# Patient Record
Sex: Female | Born: 1990 | Race: Black or African American | Hispanic: No | Marital: Single | Smoking: Former smoker
Health system: Southern US, Community
[De-identification: ages and names within clinical notes are randomized; demographics above are authoritative.]

## PROBLEM LIST (undated history)

## (undated) DIAGNOSIS — N12 Tubulo-interstitial nephritis, not specified as acute or chronic: Secondary | ICD-10-CM

---

## 2013-09-25 ENCOUNTER — Encounter (HOSPITAL_COMMUNITY): Payer: Self-pay | Admitting: Emergency Medicine

## 2013-09-25 ENCOUNTER — Emergency Department (HOSPITAL_COMMUNITY): Payer: Self-pay

## 2013-09-25 ENCOUNTER — Emergency Department (HOSPITAL_COMMUNITY)
Admission: EM | Admit: 2013-09-25 | Discharge: 2013-09-25 | Disposition: A | Discharge status: Home / Self Care | Payer: Self-pay | Attending: Emergency Medicine | Admitting: Emergency Medicine

## 2013-09-25 DIAGNOSIS — N12 Tubulo-interstitial nephritis, not specified as acute or chronic: Secondary | ICD-10-CM | POA: Insufficient documentation

## 2013-09-25 DIAGNOSIS — B9689 Other specified bacterial agents as the cause of diseases classified elsewhere: Secondary | ICD-10-CM | POA: Insufficient documentation

## 2013-09-25 DIAGNOSIS — Z79899 Other long term (current) drug therapy: Secondary | ICD-10-CM | POA: Insufficient documentation

## 2013-09-25 DIAGNOSIS — N760 Acute vaginitis: Secondary | ICD-10-CM | POA: Insufficient documentation

## 2013-09-25 DIAGNOSIS — R112 Nausea with vomiting, unspecified: Secondary | ICD-10-CM | POA: Insufficient documentation

## 2013-09-25 DIAGNOSIS — A499 Bacterial infection, unspecified: Secondary | ICD-10-CM | POA: Insufficient documentation

## 2013-09-25 DIAGNOSIS — K5900 Constipation, unspecified: Secondary | ICD-10-CM | POA: Insufficient documentation

## 2013-09-25 LAB — WET PREP, GENITAL
Trich, Wet Prep: NONE SEEN
WBC, Wet Prep HPF POC: NONE SEEN
Yeast Wet Prep HPF POC: NONE SEEN

## 2013-09-25 LAB — COMPREHENSIVE METABOLIC PANEL
ALT: 65 U/L — ABNORMAL HIGH (ref 0–35)
AST: 164 U/L — ABNORMAL HIGH (ref 0–37)
Albumin: 3.4 g/dL — ABNORMAL LOW (ref 3.5–5.2)
CO2: 23 mEq/L (ref 19–32)
Chloride: 96 mEq/L (ref 96–112)
Creatinine, Ser: 0.88 mg/dL (ref 0.50–1.10)
GFR calc non Af Amer: >90 mL/min (ref >90)
Total Bilirubin: 0.7 mg/dL (ref 0.3–1.2)
Total Protein: 8 g/dL (ref 6.0–8.3)

## 2013-09-25 LAB — CBC
HCT: 37.2 % (ref 36.0–46.0)
MCH: 27.3 pg (ref 26.0–34.0)
MCHC: 34.1 g/dL (ref 30.0–36.0)
MCV: 80 fL (ref 78.0–100.0)
Platelets: 245 10*3/uL (ref 150–400)
RBC: 4.65 MIL/uL (ref 3.87–5.11)
WBC: 20 10*3/uL — ABNORMAL HIGH (ref 4.0–10.5)

## 2013-09-25 LAB — URINALYSIS, ROUTINE W REFLEX MICROSCOPIC
Glucose, UA: NEGATIVE mg/dL
Ketones, ur: >80 mg/dL — AB
Specific Gravity, Urine: 1.02 (ref 1.005–1.030)
Urobilinogen, UA: 1 mg/dL (ref 0.0–1.0)
pH: 6 (ref 5.0–8.0)

## 2013-09-25 LAB — URINE MICROSCOPIC-ADD ON

## 2013-09-25 MED ORDER — ONDANSETRON HCL 4 MG PO TABS: 4.0000 mg | ORAL_TABLET | Freq: Four times a day (QID) | ORAL | Status: DC

## 2013-09-25 MED ORDER — DEXTROSE 5 % IV SOLN
1.0000 g | Freq: Once | INTRAVENOUS | Status: AC
  Administered 2013-09-25: 1 g via INTRAVENOUS
  Filled 2013-09-25: qty 10

## 2013-09-25 MED ORDER — ONDANSETRON HCL 4 MG/2ML IJ SOLN
4.0000 mg | INTRAMUSCULAR | Status: AC
  Administered 2013-09-25: 4 mg via INTRAVENOUS
  Filled 2013-09-25: qty 2

## 2013-09-25 MED ORDER — ACETAMINOPHEN 500 MG PO TABS
1000.0000 mg | ORAL_TABLET | Freq: Once | ORAL | Status: AC
  Administered 2013-09-25: 1000 mg via ORAL
  Filled 2013-09-25: qty 2

## 2013-09-25 MED ORDER — SODIUM CHLORIDE 0.9 % IV SOLN
1000.0000 mL | Freq: Once | INTRAVENOUS | Status: AC
  Administered 2013-09-25: 1000 mL via INTRAVENOUS

## 2013-09-25 MED ORDER — CIPROFLOXACIN HCL 500 MG PO TABS: 500.0000 mg | ORAL_TABLET | Freq: Two times a day (BID) | ORAL | Status: DC

## 2013-09-25 MED ORDER — SODIUM CHLORIDE 0.9 % IV SOLN
1000.0000 mL | INTRAVENOUS | Status: DC
  Administered 2013-09-25: 1000 mL via INTRAVENOUS

## 2013-09-25 MED ORDER — SODIUM CHLORIDE 0.9 % IV BOLUS (SEPSIS)
1000.0000 mL | Freq: Once | INTRAVENOUS | Status: AC
  Administered 2013-09-25: 1000 mL via INTRAVENOUS

## 2013-09-25 MED ORDER — HYDROCODONE-ACETAMINOPHEN 5-325 MG PO TABS: 1.0000 | ORAL_TABLET | ORAL | Status: DC | PRN

## 2013-09-25 MED ORDER — MORPHINE SULFATE 4 MG/ML IJ SOLN
4.0000 mg | Freq: Once | INTRAMUSCULAR | Status: AC
  Administered 2013-09-25: 4 mg via INTRAVENOUS
  Filled 2013-09-25: qty 1

## 2013-09-25 MED ORDER — DEXTROSE 5 % IV SOLN
1.0000 g | Freq: Once | INTRAVENOUS | Status: DC
  Filled 2013-09-25: qty 10

## 2013-09-25 MED ORDER — METRONIDAZOLE 500 MG PO TABS: 500.0000 mg | ORAL_TABLET | Freq: Two times a day (BID) | ORAL | Status: DC

## 2013-09-25 NOTE — ED Provider Notes (Signed)
Medical screening examination/treatment/procedure(s) were performed by non-physician practitioner and as supervising physician I was immediately available for consultation/collaboration.  Shanna Cisco, MD 09/25/13 2231

## 2013-09-25 NOTE — ED Provider Notes (Signed)
Care assumed from [place], [place] at shift change. Patient with L flank pain, elevated LFTs, leukocytosis of 20.0, pyelo. Due to elevated LFTs, abdominal US pending. She is getting IVF and IV abx (rocephin). If no improvement of patient's symptoms, plan to admit pending [place]. 6:31 PM Abdominal ultrasound without any acute findings. On examination, patient is well-appearing and in no apparent distress. She states her pain has subsided. Abdomen soft, nontender, nondistended. She is hemodynamically stable. Do not feel she meets requirements for admission at this time, patient feels comfortable going home. She received 1 g Rocephin IV. Will discharge him with Cipro for pyelo and flagyl for BV. She is eating and drinking well. Stable for discharge. Case discussed with my attending Dr. Micheline Maze who agrees with plan of care.   Trevor Mace, PA-C 09/25/13 1844

## 2013-09-25 NOTE — ED Notes (Signed)
She tells me she has had llq area abd. Pain, plus few episodes (~5) of vomiting since last Sun.  She tells me she had a normal period Sep't. 16, 2014.  She further c/o persistent nausea and nagging generalized h/a.  She is oriented x 4 and in no distress.

## 2013-09-25 NOTE — ED Provider Notes (Signed)
CSN: [id number]     Arrival date & time 09/25/13  1319 History   First MD Initiated Contact with Patient 09/25/13 1342     Chief Complaint  Patient presents with  . Abdominal Pain  . Constipation  . Dysuria   (Consider location/radiation/quality/duration/timing/severity/associated sxs/prior Treatment) HPI Comments: Patient is a 22 year old female with no significant past medical history who presents for persistent abdominal pain times one week. Patient states the pain is waxing and waning in severity and cramping in nature. Pain is present patient's left midabdomen and left lower quadrant. It has been associated with persistent nausea and intermittent nonbloody, nonbilious emesis x4 over the past week. Patient also admits to associated dysuria, subjective fever, and constipation. She denies chest pain, shortness of breath, melena, hematochezia, hematuria, dyspareunia, and numbness/tingling and weakness.  Patient is a 22 y.o. female presenting with abdominal pain, constipation, and dysuria. The history is provided by the patient. No language interpreter was used.  Abdominal Pain Associated symptoms: constipation, dysuria, fever (subjective), nausea and vomiting (NB/NB x 3)   Associated symptoms: no chest pain, no hematuria, no shortness of breath and no vaginal discharge   Constipation Associated symptoms: abdominal pain, dysuria, fever (subjective), nausea and vomiting (NB/NB x 3)   Dysuria Associated symptoms: abdominal pain, fever (subjective), nausea and vomiting (NB/NB x 3)   Associated symptoms: no vaginal discharge     History reviewed. No pertinent past medical history. History reviewed. No pertinent past surgical history. History reviewed. No pertinent family history. History  Substance Use Topics  . Smoking status: Never Smoker   . Smokeless tobacco: Not on file  . Alcohol Use: Yes     Comment: last drink over 2 months ago    OB History   Grav Para Term Preterm Abortions  TAB SAB Ect Mult Living                 Review of Systems  Constitutional: Positive for fever (subjective).  Respiratory: Negative for shortness of breath.   Cardiovascular: Negative for chest pain.  Gastrointestinal: Positive for nausea, vomiting (NB/NB x 3), abdominal pain and constipation. Negative for blood in stool.  Genitourinary: Positive for dysuria. Negative for hematuria and vaginal discharge.  Neurological: Negative for weakness and numbness.  All other systems reviewed and are negative.    Allergies  Review of patient's allergies indicates no known allergies.  Home Medications   Current Outpatient Rx  Name  Route  Sig  Dispense  Refill  . naproxen sodium (ANAPROX) 220 MG tablet   Oral   Take 220 mg by mouth 2 (two) times daily with a meal.          BP 121/79  Pulse 107  Temp(Src) 99.8 F (37.7 C) (Oral)  Resp 16  SpO2 100%  LMP 08/30/2013  Physical Exam  Nursing note and vitals reviewed. Constitutional: She is oriented to person, place, and time. She appears well-developed and well-nourished. No distress.  HENT:  Head: Normocephalic and atraumatic.  Eyes: Conjunctivae and EOM are normal. No scleral icterus.  Neck: Normal range of motion.  Cardiovascular: Regular rhythm, normal heart sounds and intact distal pulses.   Tachy to 102  Pulmonary/Chest: Effort normal. No respiratory distress. She has no wheezes. She has no rales.  Abdominal: Soft. She exhibits no distension and no mass. There is tenderness (LLQ and L CVA tenderness). There is no rebound and no guarding.  No peritoneal signs or guarding  Genitourinary: Uterus normal. There is no rash, tenderness, lesion  or injury on the right labia. There is no rash, tenderness, lesion or injury on the left labia. Cervix exhibits discharge (bleeding). Cervix exhibits no motion tenderness and no friability. Right adnexum displays no mass, no tenderness and no fullness. Left adnexum displays no mass, no tenderness  and no fullness. No erythema, tenderness or bleeding around the vagina. No signs of injury around the vagina. Vaginal discharge (bleeding) found.  Musculoskeletal: Normal range of motion.  Neurological: She is alert and oriented to person, place, and time.  Skin: Skin is warm and dry. No rash noted. She is not diaphoretic. No erythema. No pallor.  Psychiatric: She has a normal mood and affect. Her behavior is normal.    ED Course  Procedures (including critical care time) Labs Review Labs Reviewed  WET PREP, GENITAL - Abnormal; Notable for the following:    Clue Cells Wet Prep HPF POC MODERATE (*)    All other components within normal limits  URINALYSIS, ROUTINE W REFLEX MICROSCOPIC - Abnormal; Notable for the following:    Color, Urine AMBER (*)    APPearance TURBID (*)    Hgb urine dipstick LARGE (*)    Bilirubin Urine SMALL (*)    Ketones, ur >80 (*)    Protein, ur 100 (*)    Leukocytes, UA LARGE (*)    All other components within normal limits  CBC - Abnormal; Notable for the following:    WBC 20.0 (*)    All other components within normal limits  COMPREHENSIVE METABOLIC PANEL - Abnormal; Notable for the following:    Sodium 132 (*)    Potassium 3.4 (*)    Glucose, Bld 104 (*)    Albumin 3.4 (*)    AST 164 (*)    ALT 65 (*)    Alkaline Phosphatase 136 (*)    All other components within normal limits  LIPASE, BLOOD - Abnormal; Notable for the following:    Lipase 143 (*)    All other components within normal limits  URINE MICROSCOPIC-ADD ON - Abnormal; Notable for the following:    Bacteria, UA MANY (*)    All other components within normal limits  GC/CHLAMYDIA PROBE AMP  URINE CULTURE  CULTURE, BLOOD (ROUTINE X 2)  CULTURE, BLOOD (ROUTINE X 2)   Imaging Review No results found.  EKG Interpretation   None       MDM   1. Sepsis     Patient is a 22 year old female who presents for persistent abdominal pain with dysuria, persistent nausea and intermittent  emesis. Patient denies any emesis today. Patient is an otherwise healthy female. She is febrile today to 101.37F orally and tachycardic to 107 beats per minute with a white count of 20,000. Lab significant also for increased LFTs, increased alkaline phosphatase, and elevated lipase. Urinalysis suggestive of infection with too numerous to count white blood cells and many bacteria in the presence of few skin cells. Patient meets sepsis criteria. Lactate, blood cultures, and CXR ordered. Have also ordered abdominal U/S for further evaluation of symptoms. 1g IV Rocephin and fluid maintenance ordered.  Patient signed out to Johnnette Gourd, PA-C at shift change with imaging pending. Anticipate admission of patient once imaging resulted.     Antony Madura, PA-C 09/25/13 1552

## 2013-09-25 NOTE — ED Notes (Signed)
As I write this, she is in final stages of abd. U/s.  She is awake, alert and in no distress.

## 2013-09-25 NOTE — ED Notes (Signed)
Pt reports c/o of abdominal pain and vomiting x1 week. Reports vomited last on Friday, abdominal pain 7/10 at present. Last bowel movement over 2 weeks ago. reprots dysuria.

## 2013-09-26 LAB — GC/CHLAMYDIA PROBE AMP: CT Probe RNA: POSITIVE — AB

## 2013-09-27 LAB — URINE CULTURE

## 2013-09-27 NOTE — ED Provider Notes (Signed)
Medical screening examination/treatment/procedure(s) were performed by non-physician practitioner and as supervising physician I was immediately available for consultation/collaboration.   Bassheva Flury, MD 09/27/13 2303 

## 2013-09-28 NOTE — ED Notes (Signed)
Chart sent to EDP office for review.+ chlamydia 

## 2013-10-01 LAB — CULTURE, BLOOD (ROUTINE X 2)
Culture: NO GROWTH
Culture: NO GROWTH

## 2013-10-03 NOTE — ED Notes (Signed)
Chart returned from EDP office with orders written by Smith Mince for Azithromycin 1 gram po x 1

## 2013-10-05 ENCOUNTER — Telehealth (HOSPITAL_COMMUNITY): Payer: Self-pay | Admitting: *Deleted

## 2013-10-08 ENCOUNTER — Telehealth (HOSPITAL_COMMUNITY): Payer: Self-pay | Admitting: Emergency Medicine

## 2013-10-08 NOTE — ED Notes (Signed)
Unable to contact patient via phone. Sent letter. °

## 2013-10-28 NOTE — ED Notes (Signed)
No response to letter sent after 30 days. Chart sent to Medical Records. °

## 2015-01-15 ENCOUNTER — Other Ambulatory Visit (HOSPITAL_COMMUNITY): Payer: Self-pay | Admitting: Nurse Practitioner

## 2015-01-15 DIAGNOSIS — Z3689 Encounter for other specified antenatal screening: Secondary | ICD-10-CM

## 2015-01-19 ENCOUNTER — Ambulatory Visit (HOSPITAL_COMMUNITY): Payer: Self-pay

## 2015-12-16 NOTE — L&D Delivery Note (Signed)
25 y.o. G3P0020 at 6553w0d delivered via vacuum-assisted vaginal delivery a viable female infant in cephalic, ROA position. Vacuum applied in front of posterior fontanelle, and with appropriate suction during a contraction and assistance with good maternal effort in pushing, gentle traction was applied to deliver head to +3 station. After four pushes with no pop-offs, fetal head was delivered to perineum without complication, suction was released. No nuchal cord. Left anterior shoulder delivered with ease. 60 sec delayed cord clamping. Cord clamped x2 and cut. Placenta delivered spontaneously intact, with 3VC. Fundus firm on exam with massage and pitocin. Good hemostasis noted.  Laceration: Second degree laceration Suture: 3-0 Vicryl Good hemostasis noted.  Mom and baby recovering in LDR.    Apgars: 7/8 Weight: pending   Jen MowElizabeth Mumaw, DO [facility] Fellow [facility] 09/03/2016, 9:44 AM

## 2016-03-24 LAB — OB RESULTS CONSOLE RPR: RPR: NONREACTIVE

## 2016-03-24 LAB — OB RESULTS CONSOLE HEPATITIS B SURFACE ANTIGEN: Hepatitis B Surface Ag: NEGATIVE

## 2016-03-24 LAB — OB RESULTS CONSOLE GC/CHLAMYDIA
CHLAMYDIA, DNA PROBE: NEGATIVE
GC PROBE AMP, GENITAL: NEGATIVE

## 2016-03-24 LAB — OB RESULTS CONSOLE HIV ANTIBODY (ROUTINE TESTING): HIV: NONREACTIVE

## 2016-03-24 LAB — OB RESULTS CONSOLE RUBELLA ANTIBODY, IGM: RUBELLA: IMMUNE

## 2016-05-01 ENCOUNTER — Encounter (HOSPITAL_COMMUNITY): Payer: Self-pay | Admitting: Physician Assistant

## 2016-05-01 ENCOUNTER — Other Ambulatory Visit (HOSPITAL_COMMUNITY): Payer: Self-pay | Admitting: Physician Assistant

## 2016-05-01 DIAGNOSIS — Z3A20 20 weeks gestation of pregnancy: Secondary | ICD-10-CM

## 2016-05-01 DIAGNOSIS — Z141 Cystic fibrosis carrier: Secondary | ICD-10-CM

## 2016-05-01 DIAGNOSIS — Z3689 Encounter for other specified antenatal screening: Secondary | ICD-10-CM

## 2016-05-15 ENCOUNTER — Ambulatory Visit (HOSPITAL_COMMUNITY)
Admission: RE | Admit: 2016-05-15 | Discharge: 2016-05-15 | Disposition: A | Discharge status: Home / Self Care | Payer: Medicaid Other | Source: Ambulatory Visit | Attending: Physician Assistant | Admitting: Physician Assistant

## 2016-05-15 ENCOUNTER — Encounter (HOSPITAL_COMMUNITY): Payer: Self-pay

## 2016-05-15 DIAGNOSIS — Z3689 Encounter for other specified antenatal screening: Secondary | ICD-10-CM

## 2016-05-15 DIAGNOSIS — O09892 Supervision of other high risk pregnancies, second trimester: Secondary | ICD-10-CM | POA: Insufficient documentation

## 2016-05-15 DIAGNOSIS — Z141 Cystic fibrosis carrier: Secondary | ICD-10-CM | POA: Insufficient documentation

## 2016-05-15 DIAGNOSIS — Z3A20 20 weeks gestation of pregnancy: Secondary | ICD-10-CM | POA: Insufficient documentation

## 2016-05-15 DIAGNOSIS — O280 Abnormal hematological finding on antenatal screening of mother: Secondary | ICD-10-CM | POA: Insufficient documentation

## 2016-05-15 DIAGNOSIS — Z3A21 21 weeks gestation of pregnancy: Secondary | ICD-10-CM | POA: Insufficient documentation

## 2016-05-15 DIAGNOSIS — Z36 Encounter for antenatal screening of mother: Secondary | ICD-10-CM | POA: Insufficient documentation

## 2016-05-15 NOTE — Progress Notes (Signed)
Genetic Counseling  High-Risk Gestation Note  Appointment Date:  05/15/2016 Referred By: Randolm Idol, PA-C Date of Birth:  1991/03/29 Partner:  Allie Dimmer   Pregnancy History: [id number] Estimated Date of Delivery: 09/24/16 Estimated Gestational Age: 10w1dAttending: MRenella Cunas MD   I met with 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    In summary:   Discussed cystic fibrosis and autosomal recessive inheritance  Discussed availability of carrier screening for the father of the baby  Given no known family history and his reported ethnicity, background carrier frequency for him is approximately 1 in 610 Reviewed limitations of screening  Reviewed risks to the fetus prior to carrier screening for the father of the baby  Risk for cystic fibrosis in current pregnancy is approximately 1 in 236(0.4%), prior to carrier screening   Patient reported father of the pregnancy is not likely interested in pursuing CF carrier screening  Discussed option of prenatal diagnosis only when mutations have been identified in both parents  Declined amniocentesis   Understand limitation of ultrasound in diagnosis of CF  Patient is comfortable with newborn screening for CF and understands the limitations of NBS for CF  Reviewed borderline MSAFP (2.11 MoM), screen negative for ONTDs  Detailed ultrasound performed today  Reviewed additional family history concern of sister to patient with cleft lip and palate   We reviewed the results of Ms. BStanford BreedCF carrier screening.  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 CF carrier screening has not yet been performed for the father of the pregnancy.  Ms. CScalisireported no additional relatives known to be CF carriers and no known relatives with cystic fibrosis.  The  patient reported no known individuals with cystic fibrosis in the family history for the father of the pregnancy, and consanguinity for the couple was denied. He reportedly has African American ancestry.   We spent time discussing cystic fibrosis. Classic features of CF include thickened secretions in the lungs, digestive and reproductive systems. This life-limiting condition is characterized by chronic respiratory infections requiring daily chest therapies and pancreatic dysfunction disrupting the body's ability to break down food and extract nutrients as it should, which may restrict growth. Infertility commonly occurs in males. With therapies, such as daily respiratory therapies and medications to aid digestion, the median lifespan for people with CF is now mid-30's. Treatment may involve lung transplantation in some cases. There can be significant variability in the severity of symptoms and expression of the disease. Expression of the disease depends upon the specific mutations present in an individual with CF.   We spent time reviewing the autosomal recessive inheritance of CF. CF is a common genetic condition in the Caucasian population occurring in approximately 1 in 336,300Caucasian births. This means approximately 1 in 29 Caucasians is a CF carrier. We discussed that the carrier frequency in the African American population is approximately 1 in 686  We discussed that individuals who are carriers have one copy of the CFTR gene with a disease causing mutation, and their other CFTR gene copy functions correctly. Thus, carriers typically do not have associated medical symptoms. We discussed that when both parents are carriers for CF, each pregnancy has an independent chance for one of the following outcomes: a 25% chance to inherit both mutations and thus have CF; a 50% chance to inherit one gene  mutation and be a carrier similar to parents; and a 25% chance to be neither a carrier nor have CF. When one  parent is a CF carrier but the other is not, then each pregnancy has a 1 in 2 chance to be a CF carrier but would not be expected to be at increased risk to inherit CF.   There are known to be thousands of mutations which can cause the CFTR gene to not function properly. Carrier screening is available to assess for the most common disease causing mutations. However, carrier screening does not identify all CF carriers. Thus, a negative CF carrier screen would reduce, but not eliminate, the chance to be a CF carrier and thus the chance for CF in a pregnancy.  Detection rates vary with ethnicity and with the number of mutations screened by the particular lab performing the test.   We reviewed that when both parents are identified to be CF carriers, prenatal diagnosis via amniocentesis would be available, if desired. The risks, benefits, and limitations of amniocentesis were reviewed. A fetus with cystic fibrosis typically appears normal on targeted ultrasound, although rarely echogenic bowel is visualized. However, the presence of echogenic bowel on targeted ultrasound is not diagnostic for CF in a pregnancy, nor does the absence of echogenic bowel on ultrasound rule out CF in the pregnancy. We discussed that postnatal testing for CF can also be performed for babies identified to be at risk to inherit CF. She understands that in [place], the newborn screening test will detect CF, but that carriers may come back as false positives.  Given that the father of the pregnancy reports no family history of CF, he would have the general population chance to be a carrier, or approximately 1 in 41, prior to carrier screening. Thus, prior to carrier screening for him, the chance for an affected pregnancy with cystic fibrosis is approximately 1 in 260 (0.4%).  We discussed that CF carrier screening for the father of the pregnancy would further refine the risk for CF in the current pregnancy.  The patient reported  that she previously discussed this with the father of the pregnancy and that he is not likely interested in CF carrier screening. She stated that she would not be interested in amniocentesis in pregnancy for cystic fibrosis and is comfortable with newborn screening for CF.   Both family histories were reviewed and were otherwise contributory for cleft lip and palate for the patient's twin sister (reportedly fraternal twins). Her sister is otherwise healthy. We discussed that cleft lip +/- cleft palate can be syndromic or isolated.  If the patient's relative has a syndromic form of clefting, the chance of having an affected child depends on the inheritance pattern of that condition.  If the patient's relative has an isolated form of clefting, we discussed the probable multifactorial inheritance and explained that genetic testing for isolated cleft lip +/- cleft palate is not currently available.  Based on the family history, the patient's chance to have a baby with an isolated cleft lip +/- cleft palate is ~ 0.6%. We reviewed the benefits and limitations of detailed ultrasound to assess for orofacial clefting in pregnancy. Detailed ultrasound was performed today. Complete ultrasound results reported separately. Without further information regarding the provided family history, an accurate genetic risk cannot be calculated. Further genetic counseling is warranted if more information is obtained.  Additionally, we reviewed the results of Quad screen performed through [facility] [address]. MSAFP was reported to  be borderline elevated (2.11 MoM). We reviewed that this is technically considered a screen negative result for open neural tube defects. We reviewed the benefits and limitations of Quad screening. Detailed ultrasound was performed today.   Mrs. Zyion Leidner denied exposure to environmental toxins or chemical agents. She denied the use of alcohol, tobacco or street drugs. She  denied significant viral illnesses during the course of her pregnancy. Her medical and surgical histories were noncontributory.   I counseled Ms. Stanford Walton regarding the above risks and available options.  The approximate face-to-face time with the genetic counselor was 30 minutes.  Chipper Oman, MS Certified Genetic Counselor 05/15/2016

## 2016-05-28 ENCOUNTER — Other Ambulatory Visit (HOSPITAL_COMMUNITY): Payer: Self-pay

## 2016-09-02 ENCOUNTER — Inpatient Hospital Stay (HOSPITAL_COMMUNITY)
Admission: AD | Admit: 2016-09-02 | Discharge: 2016-09-04 | DRG: 775 | Disposition: A | Discharge status: Home / Self Care | Payer: Self-pay | Source: Ambulatory Visit | Attending: Obstetrics and Gynecology | Admitting: Obstetrics and Gynecology

## 2016-09-02 ENCOUNTER — Inpatient Hospital Stay (HOSPITAL_COMMUNITY): Payer: Self-pay | Admitting: Anesthesiology

## 2016-09-02 ENCOUNTER — Encounter (HOSPITAL_COMMUNITY): Payer: Self-pay | Admitting: *Deleted

## 2016-09-02 DIAGNOSIS — Z3A36 36 weeks gestation of pregnancy: Secondary | ICD-10-CM

## 2016-09-02 DIAGNOSIS — O42019 Preterm premature rupture of membranes, onset of labor within 24 hours of rupture, unspecified trimester: Secondary | ICD-10-CM | POA: Insufficient documentation

## 2016-09-02 DIAGNOSIS — O26893 Other specified pregnancy related conditions, third trimester: Secondary | ICD-10-CM | POA: Diagnosis present

## 2016-09-02 DIAGNOSIS — Z87891 Personal history of nicotine dependence: Secondary | ICD-10-CM

## 2016-09-02 DIAGNOSIS — Z141 Cystic fibrosis carrier: Secondary | ICD-10-CM

## 2016-09-02 DIAGNOSIS — Z6791 Unspecified blood type, Rh negative: Secondary | ICD-10-CM

## 2016-09-02 DIAGNOSIS — O42013 Preterm premature rupture of membranes, onset of labor within 24 hours of rupture, third trimester: Principal | ICD-10-CM | POA: Diagnosis present

## 2016-09-02 HISTORY — DX: Tubulo-interstitial nephritis, not specified as acute or chronic: N12

## 2016-09-02 LAB — CBC
HCT: 37.4 % (ref 36.0–46.0)
Hemoglobin: 12.8 g/dL (ref 12.0–15.0)
MCH: 28.6 pg (ref 26.0–34.0)
MCHC: 34.2 g/dL (ref 30.0–36.0)
MCV: 83.5 fL (ref 78.0–100.0)
PLATELETS: 164 10*3/uL (ref 150–400)
RBC: 4.48 MIL/uL (ref 3.87–5.11)
RDW: 13.6 % (ref 11.5–15.5)
WBC: 9.2 10*3/uL (ref 4.0–10.5)

## 2016-09-02 LAB — POCT FERN TEST: POCT Fern Test: POSITIVE

## 2016-09-02 MED ORDER — LIDOCAINE HCL (PF) 1 % IJ SOLN
30.0000 mL | INTRAMUSCULAR | Status: DC | PRN
  Administered 2016-09-03: 30 mL via SUBCUTANEOUS
  Filled 2016-09-02: qty 30

## 2016-09-02 MED ORDER — FENTANYL 2.5 MCG/ML BUPIVACAINE 1/10 % EPIDURAL INFUSION (WH - ANES)
14.0000 mL/h | INTRAMUSCULAR | Status: DC | PRN
  Administered 2016-09-02 – 2016-09-03 (×2): 14 mL/h via EPIDURAL
  Filled 2016-09-02 (×2): qty 125

## 2016-09-02 MED ORDER — EPHEDRINE 5 MG/ML INJ
10.0000 mg | INTRAVENOUS | Status: AC | PRN
  Administered 2016-09-03 (×2): 10 mg via INTRAVENOUS

## 2016-09-02 MED ORDER — OXYCODONE-ACETAMINOPHEN 5-325 MG PO TABS
2.0000 | ORAL_TABLET | ORAL | Status: DC | PRN
Start: 2016-09-02 — End: 2016-09-03

## 2016-09-02 MED ORDER — OXYTOCIN 40 UNITS IN LACTATED RINGERS INFUSION - SIMPLE MED: 2.5000 [IU]/h | INTRAVENOUS | Status: DC

## 2016-09-02 MED ORDER — PENICILLIN G POTASSIUM 5000000 UNITS IJ SOLR
5.0000 10*6.[IU] | Freq: Once | INTRAMUSCULAR | Status: AC
  Administered 2016-09-02: 5 10*6.[IU] via INTRAVENOUS
  Filled 2016-09-02: qty 5

## 2016-09-02 MED ORDER — SOD CITRATE-CITRIC ACID 500-334 MG/5ML PO SOLN: 30.0000 mL | ORAL | Status: DC | PRN

## 2016-09-02 MED ORDER — TERBUTALINE SULFATE 1 MG/ML IJ SOLN: 0.2500 mg | Freq: Once | INTRAMUSCULAR | Status: DC | PRN

## 2016-09-02 MED ORDER — BETAMETHASONE SOD PHOS & ACET 6 (3-3) MG/ML IJ SUSP
12.0000 mg | Freq: Two times a day (BID) | INTRAMUSCULAR | Status: DC
  Administered 2016-09-02: 12 mg via INTRAMUSCULAR
  Filled 2016-09-02 (×2): qty 2

## 2016-09-02 MED ORDER — OXYTOCIN BOLUS FROM INFUSION
500.0000 mL | Freq: Once | INTRAVENOUS | Status: AC
  Administered 2016-09-03: 500 mL via INTRAVENOUS

## 2016-09-02 MED ORDER — PENICILLIN G POTASSIUM 5000000 UNITS IJ SOLR
2.5000 10*6.[IU] | INTRAMUSCULAR | Status: DC
  Administered 2016-09-02 – 2016-09-03 (×3): 2.5 10*6.[IU] via INTRAVENOUS
  Filled 2016-09-02 (×5): qty 2.5

## 2016-09-02 MED ORDER — PHENYLEPHRINE 40 MCG/ML (10ML) SYRINGE FOR IV PUSH (FOR BLOOD PRESSURE SUPPORT)
80.0000 ug | PREFILLED_SYRINGE | INTRAVENOUS | Status: DC | PRN
  Administered 2016-09-03: 02:00:00 via INTRAVENOUS
  Filled 2016-09-02 (×2): qty 10

## 2016-09-02 MED ORDER — ONDANSETRON HCL 4 MG/2ML IJ SOLN: 4.0000 mg | Freq: Four times a day (QID) | INTRAMUSCULAR | Status: DC | PRN

## 2016-09-02 MED ORDER — BETAMETHASONE SOD PHOS & ACET 6 (3-3) MG/ML IJ SUSP: 12.0000 mg | Freq: Once | INTRAMUSCULAR | Status: DC

## 2016-09-02 MED ORDER — OXYTOCIN 40 UNITS IN LACTATED RINGERS INFUSION - SIMPLE MED
1.0000 m[IU]/min | INTRAVENOUS | Status: DC
  Administered 2016-09-02: 2 m[IU]/min via INTRAVENOUS
  Filled 2016-09-02: qty 1000

## 2016-09-02 MED ORDER — PHENYLEPHRINE 40 MCG/ML (10ML) SYRINGE FOR IV PUSH (FOR BLOOD PRESSURE SUPPORT)
80.0000 ug | PREFILLED_SYRINGE | INTRAVENOUS | Status: DC | PRN
  Administered 2016-09-02 – 2016-09-03 (×2): 80 ug via INTRAVENOUS

## 2016-09-02 MED ORDER — LACTATED RINGERS IV SOLN
INTRAVENOUS | Status: DC
  Administered 2016-09-02 – 2016-09-03 (×4): via INTRAVENOUS

## 2016-09-02 MED ORDER — EPHEDRINE 5 MG/ML INJ
10.0000 mg | INTRAVENOUS | Status: DC | PRN
  Filled 2016-09-02: qty 4

## 2016-09-02 MED ORDER — LACTATED RINGERS IV SOLN
500.0000 mL | Freq: Once | INTRAVENOUS | Status: AC
  Administered 2016-09-02: 500 mL via INTRAVENOUS

## 2016-09-02 MED ORDER — ACETAMINOPHEN 325 MG PO TABS: 650.0000 mg | ORAL_TABLET | ORAL | Status: DC | PRN

## 2016-09-02 MED ORDER — LACTATED RINGERS IV SOLN
500.0000 mL | INTRAVENOUS | Status: DC | PRN
  Administered 2016-09-02 – 2016-09-03 (×2): 500 mL via INTRAVENOUS

## 2016-09-02 MED ORDER — DIPHENHYDRAMINE HCL 50 MG/ML IJ SOLN: 12.5000 mg | INTRAMUSCULAR | Status: DC | PRN

## 2016-09-02 MED ORDER — FLEET ENEMA 7-19 GM/118ML RE ENEM: 1.0000 | ENEMA | RECTAL | Status: DC | PRN

## 2016-09-02 MED ORDER — OXYCODONE-ACETAMINOPHEN 5-325 MG PO TABS: 1.0000 | ORAL_TABLET | ORAL | Status: DC | PRN

## 2016-09-02 NOTE — MAU Note (Signed)
States she felt a gush of clear fluid around 1425 and has continued. Denies pain or contractions.

## 2016-09-02 NOTE — Anesthesia Pain Management Evaluation Note (Signed)
  CRNA Pain Management Visit Note  Patient: Alyssa Walton, 25 y.o., female  "Hello [facility] am [facility]. We have an anesthesia team available at all times to provide care throughout the hospital, including epidural management and anesthesia for C-section. I don't know your plan for the delivery whether it a natural birth, water birth, IV sedation, nitrous supplementation, doula or epidural, but we want to meet your pain goals."   1.Was your pain managed to your expectations on prior hospitalizations?    2.What is your expectation for pain management during this hospitalization?      3.How can we help you reach that goal?   Record the patient's initial score and the patient's pain goal.   Pain: 6  Pain Goal: 8 [facility] wants you to be able to say your pain was always managed very well.  Alyssa Walton,Alyssa Walton 09/02/2016

## 2016-09-02 NOTE — Anesthesia Preprocedure Evaluation (Signed)
Anesthesia Evaluation  Patient identified by MRN, date of birth, ID band Patient awake    Reviewed: Allergy & Precautions, NPO status , Patient's Chart, lab work & pertinent test results  Airway Mallampati: II  TM Distance: >3 FB Neck ROM: Full    Dental no notable dental hx.    Pulmonary former smoker,    Pulmonary exam normal breath sounds clear to auscultation       Cardiovascular negative cardio ROS Normal cardiovascular exam Rhythm:Regular Rate:Normal     Neuro/Psych negative neurological ROS  negative psych ROS   GI/Hepatic negative GI ROS, Neg liver ROS,   Endo/Other  negative endocrine ROS  Renal/GU negative Renal ROS  negative genitourinary   Musculoskeletal negative musculoskeletal ROS (+)   Abdominal   Peds negative pediatric ROS (+)  Hematology negative hematology ROS (+)   Anesthesia Other Findings   Reproductive/Obstetrics negative OB ROS                            Anesthesia Physical Anesthesia Plan  ASA: II  Anesthesia Plan: Epidural   Post-op Pain Management:    Induction: Intravenous  Airway Management Planned: Natural Airway  Additional Equipment:   Intra-op Plan:   Post-operative Plan:   Informed Consent: I have reviewed the patients History and Physical, chart, labs and discussed the procedure including the risks, benefits and alternatives for the proposed anesthesia with the patient or authorized representative who has indicated his/her understanding and acceptance.   Dental advisory given  Plan Discussed with: CRNA  Anesthesia Plan Comments: (Informed consent obtained prior to proceeding including risk of failure, 1% risk of PDPH, risk of minor discomfort and bruising.  Discussed rare but serious complications including epidural abscess, permanent nerve injury, epidural hematoma.  Discussed alternatives to epidural analgesia and patient desires to  proceed.  Timeout performed pre-procedure verifying patient name, procedure, and platelet count.  Patient tolerated procedure well. )      Anesthesia Quick Evaluation  

## 2016-09-02 NOTE — H&P (Signed)
LABOR AND DELIVERY ADMISSION HISTORY AND PHYSICAL NOTE  Alyssa Walton is a 25 y.o. female G3P0020 with IUP at 6921w6d by LMP and 20.4 wk U/S presenting for SOL.  States felt her water break around 2pm today and then started having contractions upon arrival to MAU approximately 2 hours later. She reports positive fetal movement. She denies vaginal bleeding.  Prenatal History/Complications: none  Past Medical History: Past Medical History:  Diagnosis Date  . Pyelonephritis     Past Surgical History: History reviewed. No pertinent surgical history.  Obstetrical History: OB History    Gravida Para Term Preterm AB Living   3       2     SAB TAB Ectopic Multiple Live Births     2            Social History: Social History   Social History  . Marital status: Single    Spouse name: N/A  . Number of children: N/A  . Years of education: N/A   Social History Main Topics  . Smoking status: Former Games developermoker  . Smokeless tobacco: Never Used  . Alcohol use Yes     Comment: last drink over 2 months ago   . Drug use: No  . Sexual activity: Not Asked   Other Topics Concern  . None   Social History Narrative  . None    Family History: Family History  Problem Relation Age of Onset  . Cleft lip Sister   . Cleft palate Sister     Allergies: No Known Allergies  Prescriptions Prior to Admission  Medication Sig Dispense Refill Last Dose  . ciprofloxacin (CIPRO) 500 MG tablet Take 1 tablet (500 mg total) by mouth 2 (two) times daily. (Patient not taking: Reported on 05/15/2016) 20 tablet 0 Not Taking  . HYDROcodone-acetaminophen (NORCO/VICODIN) 5-325 MG per tablet Take 1-2 tablets by mouth every 4 (four) hours as needed for pain. (Patient not taking: Reported on 05/15/2016) 10 tablet 0 Not Taking  . metroNIDAZOLE (FLAGYL) 500 MG tablet Take 1 tablet (500 mg total) by mouth 2 (two) times daily. One po bid x 7 days (Patient not taking: Reported on 05/15/2016) 14 tablet 0 Not Taking  .  naproxen sodium (ANAPROX) 220 MG tablet Take 220 mg by mouth 2 (two) times daily with a meal. Reported on 05/15/2016   Not Taking  . ondansetron (ZOFRAN) 4 MG tablet Take 1 tablet (4 mg total) by mouth every 6 (six) hours. (Patient not taking: Reported on 05/15/2016) 12 tablet 0 Not Taking  . Prenatal Vit w/Fe-Methylfol-FA (PNV PO) Take by mouth.   Taking     Review of Systems   All systems reviewed and negative except as stated in HPI  Blood pressure 111/74, pulse 106, temperature 99.3 F (37.4 C), temperature source Oral, resp. rate 16, height 5' 6.5" (1.689 m), weight 64.9 kg (143 lb), last menstrual period 12/19/2015. General appearance: alert, cooperative and no distress Lungs: no respiratory distress Heart: regular rate Abdomen: soft, non-tender Extremities: No calf swelling or tenderness Presentation: cephalic by cervical exam in MAU by RN Fetal monitoring: 140 baseline, moderate variability, +accelerations, no decelerations Uterine activity: q2-75min Dilation: 1.5 Effacement (%): 70 Station: -3 Exam by:: Sarajane MarekS. Carrera, RNC   Prenatal labs: ABO, Rh:  B neg Antibody:  neg Rubella: immune RPR:   neg HBsAg:   neg HIV:   nonreactive GBS:   unknown  Prenatal Transfer Tool  Maternal Diabetes: No Genetic Screening: Normal Maternal Ultrasounds/Referrals: Normal Fetal Ultrasounds or  other Referrals:  None Maternal Substance Abuse:  No Significant Maternal Medications:  None Significant Maternal Lab Results: Lab values include: Other:  GBS unknown  Results for orders placed or performed during the hospital encounter of 09/02/16 (from the past 24 hour(s))  [facility] Time: 09/02/16  4:18 PM  Result Value Ref Range   POCT Fern Test Positive = ruptured amniotic membanes     Patient Active Problem List   Diagnosis Date Noted  . 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 Abnormal maternal serum screening test 05/15/2016  . [redacted] weeks  gestation of pregnancy     Assessment: Alyssa Walton is a 25 y.o. G3P0020 at [redacted]w[redacted]d here for SOL  #Labor:expectant management #Pain: Planning on epidural #FWB: Category I #ID:  GBS unknown #MOF: breast and bottle #MOC: undecided #Circ:  Girl, n/a  Leland Her, DO PGY-1 9/19/20174:39 PM   OB FELLOW HISTORY AND PHYSICAL ATTESTATION  I have seen and examined this patient; I agree with above documentation in the resident's note.    Jen Mow, DO OB Fellow 09/02/2016, 11:37 PM

## 2016-09-03 ENCOUNTER — Encounter (HOSPITAL_COMMUNITY): Payer: Self-pay | Admitting: *Deleted

## 2016-09-03 DIAGNOSIS — O42013 Preterm premature rupture of membranes, onset of labor within 24 hours of rupture, third trimester: Secondary | ICD-10-CM

## 2016-09-03 DIAGNOSIS — Z141 Cystic fibrosis carrier: Secondary | ICD-10-CM

## 2016-09-03 DIAGNOSIS — O26893 Other specified pregnancy related conditions, third trimester: Secondary | ICD-10-CM

## 2016-09-03 DIAGNOSIS — Z87891 Personal history of nicotine dependence: Secondary | ICD-10-CM

## 2016-09-03 DIAGNOSIS — Z3A36 36 weeks gestation of pregnancy: Secondary | ICD-10-CM

## 2016-09-03 DIAGNOSIS — Z6791 Unspecified blood type, Rh negative: Secondary | ICD-10-CM

## 2016-09-03 LAB — RPR: RPR Ser Ql: NONREACTIVE

## 2016-09-03 MED ORDER — ONDANSETRON HCL 4 MG/2ML IJ SOLN: 4.0000 mg | INTRAMUSCULAR | Status: DC | PRN

## 2016-09-03 MED ORDER — DIBUCAINE 1 % RE OINT: 1.0000 "application " | TOPICAL_OINTMENT | RECTAL | Status: DC | PRN

## 2016-09-03 MED ORDER — LIDOCAINE HCL (PF) 1 % IJ SOLN
INTRAMUSCULAR | Status: DC | PRN
  Administered 2016-09-02 (×2): 6 mL

## 2016-09-03 MED ORDER — SODIUM CHLORIDE 0.9 % IV SOLN: 250.0000 mL | INTRAVENOUS | Status: DC | PRN

## 2016-09-03 MED ORDER — COCONUT OIL OIL: 1.0000 "application " | TOPICAL_OIL | Status: DC | PRN

## 2016-09-03 MED ORDER — WITCH HAZEL-GLYCERIN EX PADS: 1.0000 "application " | MEDICATED_PAD | CUTANEOUS | Status: DC | PRN

## 2016-09-03 MED ORDER — LACTATED RINGERS IV SOLN
500.0000 mL | Freq: Once | INTRAVENOUS | Status: AC
  Administered 2016-09-03: 500 mL via INTRAVENOUS

## 2016-09-03 MED ORDER — OXYTOCIN 40 UNITS IN LACTATED RINGERS INFUSION - SIMPLE MED: 2.5000 [IU]/h | INTRAVENOUS | Status: DC | PRN

## 2016-09-03 MED ORDER — ACETAMINOPHEN 325 MG PO TABS
650.0000 mg | ORAL_TABLET | ORAL | Status: DC | PRN
  Administered 2016-09-03: 650 mg via ORAL
  Filled 2016-09-03: qty 2

## 2016-09-03 MED ORDER — ZOLPIDEM TARTRATE 5 MG PO TABS: 5.0000 mg | ORAL_TABLET | Freq: Every evening | ORAL | Status: DC | PRN

## 2016-09-03 MED ORDER — SIMETHICONE 80 MG PO CHEW: 80.0000 mg | CHEWABLE_TABLET | ORAL | Status: DC | PRN

## 2016-09-03 MED ORDER — PRENATAL MULTIVITAMIN CH
1.0000 | ORAL_TABLET | Freq: Every day | ORAL | Status: DC
  Administered 2016-09-04: 1 via ORAL
  Filled 2016-09-03: qty 1

## 2016-09-03 MED ORDER — TETANUS-DIPHTH-ACELL PERTUSSIS 5-2.5-18.5 LF-MCG/0.5 IM SUSP: 0.5000 mL | Freq: Once | INTRAMUSCULAR | Status: DC

## 2016-09-03 MED ORDER — SODIUM CHLORIDE 0.9% FLUSH: 3.0000 mL | INTRAVENOUS | Status: DC | PRN

## 2016-09-03 MED ORDER — BENZOCAINE-MENTHOL 20-0.5 % EX AERO
1.0000 "application " | INHALATION_SPRAY | CUTANEOUS | Status: DC | PRN
  Administered 2016-09-03: 1 via TOPICAL
  Filled 2016-09-03: qty 56

## 2016-09-03 MED ORDER — ONDANSETRON HCL 4 MG PO TABS: 4.0000 mg | ORAL_TABLET | ORAL | Status: DC | PRN

## 2016-09-03 MED ORDER — SODIUM CHLORIDE 0.9% FLUSH: 3.0000 mL | Freq: Two times a day (BID) | INTRAVENOUS | Status: DC

## 2016-09-03 MED ORDER — DIPHENHYDRAMINE HCL 25 MG PO CAPS: 25.0000 mg | ORAL_CAPSULE | Freq: Four times a day (QID) | ORAL | Status: DC | PRN

## 2016-09-03 MED ORDER — IBUPROFEN 600 MG PO TABS
600.0000 mg | ORAL_TABLET | Freq: Four times a day (QID) | ORAL | Status: DC
  Administered 2016-09-03 – 2016-09-04 (×4): 600 mg via ORAL
  Filled 2016-09-03 (×4): qty 1

## 2016-09-03 MED ORDER — SENNOSIDES-DOCUSATE SODIUM 8.6-50 MG PO TABS
2.0000 | ORAL_TABLET | ORAL | Status: DC
  Administered 2016-09-04: 2 via ORAL
  Filled 2016-09-03: qty 2

## 2016-09-03 NOTE — Lactation Note (Signed)
This note was copied from a baby's chart. Lactation Consultation Note  Patient Name: Alyssa Walton RUEAV'WToday's Date: 09/03/2016 Reason for consult: Initial assessment;Infant < 6lbs;Late preterm infant   Initial consult with first time mom of 1 hour old infant. Infant born at 37w GAweighing 5 lb 4.7 oz. Infant was being held STS with mom and was quietly alert.   Assisted mom in latching infant to right breast in cross cradle hold. Infant latched easily with flanged lips. She needed chin readjustment due to clicking and narrow gape. Mom with compressible breasts and short shaft everted nipples. Nipple to right breast small than on left. Colostrum was easily expressible from both breasts. Enc mom to hand express prior to latch and to compress/masage breast with feedng. Infant fed for 15 minutes and then was burped and latched to left breast where she fed for another 15 minutes. She was noted to have intermittent swallows. Infant stopped feeding at about 30 minutes and was taken to warmer for NB assessment. Infant blood glucose was drawn at the end of the feeding.   Bf basics reviewed with mom. [employer] and [employer] hold reviewed. Reviewed LPT infant policy with parents and discussed importance of feeding infant at least every 2-3 hours at first feeding cues, awakening at 3 hours as needed to get infant to feed. Mom voiced understanding. Discussed with mom that spoon feeding will be taught and encouraged for infant. Feeding log given and parents advised to keep feeding log.   BF Resources Handout and LC Brochure given, mom informed of LC phone #, IP/OP Services, and BF Support Groups. [facility] and was informed to call WIC post d/c for appt. Mom does not have a pump at home. Enc mom to call out to desk for feeding assistance as needed.    Maternal Data Formula Feeding for Exclusion: No Has patient been taught Hand Expression?: Yes Does the patient have breastfeeding experience prior to  this delivery?: No  Feeding Feeding Type: Breast Fed Length of feed: 30 min  LATCH Score/Interventions Latch: Grasps breast easily, tongue down, lips flanged, rhythmical sucking.  Audible Swallowing: Spontaneous and intermittent  Type of Nipple: Everted at rest and after stimulation  Comfort (Breast/Nipple): Soft / non-tender     Hold (Positioning): Assistance needed to correctly position infant at breast and maintain latch. Intervention(s): Breastfeeding basics reviewed;Support Pillows;Position options;Skin to skin  LATCH Score: 9  Lactation Tools Discussed/Used WIC Program: Yes   Consult Status Consult Status: Follow-up Date: 09/03/16 Follow-up type: In-patient    Silas FloodSharon S Ronisha Herringshaw 09/03/2016, 10:59 AM

## 2016-09-03 NOTE — Anesthesia Procedure Notes (Signed)
Epidural Patient location during procedure: OB Start time: 09/02/2016 9:55 PM  Staffing Anesthesiologist: Sherrian DiversENENNY, Elon Lomeli  Preanesthetic Checklist Completed: patient identified, site marked, surgical consent, pre-op evaluation, timeout performed, IV checked, risks and benefits discussed and monitors and equipment checked  Epidural Patient position: sitting Prep: DuraPrep Patient monitoring: blood pressure and heart rate Approach: midline Location: L4-L5 Injection technique: LOR saline  Needle:  Needle type: Tuohy  Needle gauge: 17 G Needle length: 9 cm Needle insertion depth: 5 cm Catheter at skin depth: 13 cm Test dose: negative and Other  Assessment Events: blood not aspirated, injection not painful, no injection resistance, negative IV test and no paresthesia  Additional Notes Reason for block:procedure for pain

## 2016-09-03 NOTE — Anesthesia Postprocedure Evaluation (Signed)
Anesthesia Post Note  Patient: Alyssa SneddonBenita Walton  Procedure(s) Performed: * No procedures listed *  Patient location during evaluation: Mother Baby Anesthesia Type: Epidural Level of consciousness: awake and alert Pain management: pain level controlled Vital Signs Assessment: post-procedure vital signs reviewed and stable Respiratory status: spontaneous breathing, nonlabored ventilation and respiratory function stable Cardiovascular status: stable Postop Assessment: no headache, no backache and epidural receding Anesthetic complications: no     Last Vitals:  Vitals:   09/03/16 1115 09/03/16 1220  BP: (!) 93/58 94/66  Pulse: (!) 59 (!) 59  Resp: 16 16  Temp: 36.3 C 36.4 C    Last Pain:  Vitals:   09/03/16 1220  TempSrc: Oral  PainSc:    Pain Goal:                 Junious SilkGILBERT,Jeana Kersting

## 2016-09-03 NOTE — Lactation Note (Signed)
This note was copied from a baby's chart. Lactation Consultation Note  Patient Name: Alyssa Marjory SneddonBenita Schorsch JXBJY'NToday's Date: 09/03/2016 Reason for consult: Follow-up assessment  F/u visit for early-term infant. Parents were very involved with their phones when I was trying to speak with them. When I asked how infant had bottle-fed w/the last feeding, they could not give me a quantity. Mom finally showed me with her finger about how much EBM had been in the bottle. It appears that the infant only took 2mL.  Mom no longer wants to breastfeed. Formula provided (regular Similac). I reminded them to feed q3h & explained that if infant has such poor intake w/a bottle at the next feeding, they need to let their RN know. Denyce Robert. Scott, RN made aware of the above.  Lurline HareRichey, Ronne Savoia [facility] 09/03/2016, 7:47 PM

## 2016-09-03 NOTE — Progress Notes (Signed)
Patient ID: Alyssa SneddonBenita Piggott, female   DOB: 1991-05-25, 25 y.o.   MRN: [id number]  S: Received call from RN due to FWB. Patient is comfortable with epidural. RN has done position changes.  O: Vitals:   09/02/16 2330 09/02/16 2348 09/02/16 2350 09/02/16 2357  BP: (!) 97/49  (!) 98/55 108/68  Pulse: 75  75 76  Resp: 16  16 16   Temp:  97.7 F (36.5 C)    TempSrc:  Oral    SpO2:    97%  Weight:      Height:        Dilation: 4 Effacement (%): 80 Cervical Position: Posterior Station: -2 Presentation: Vertex Exam by:: Len ChildsK. Lashley RNC  FSE placed Pitocin was at 4, has been shut off IVF bolus Phenylephrine has been given previously (baseline BPs have been 90s/50s prior to epidural), most recent BP 108/68  A/P: Pitocin break for now Await improvement in FWB Continue phenylephrine for BPs <90/50

## 2016-09-03 NOTE — Anesthesia Rounding Note (Signed)
  CRNA Epidural Rounding Note  Patient: Marjory SneddonBenita Quizon, 25 y.o., female  Patient's current pain level: 'good' per patient  Agreed upon pain management level: 6  Epidural intervention: No   Comments: Patient complete and beginning to push  [facility], IndianapolisEIGHT,Arijana Narayan 09/03/2016

## 2016-09-03 NOTE — Progress Notes (Addendum)
Patient ID: Marjory SneddonBenita Bensinger, female   DOB: [birth year], 25 y.o.   MRN: [id number]  S: Patient seen and examined for progress of labor/pushing. Patient has been pushing for 2 hours. Patient comfortable under epidural anesthesia, not feeling contractions or pressure.  O:  Vitals:   09/03/16 0715 09/03/16 0720  BP: 98/60   Pulse: 99   Resp:  16  Temp:     Dilation: 10 Dilation Complete Date: 09/03/16 Dilation Complete Time: 0244 Effacement (%): 100 Cervical Position: Posterior Station: 0, +1 Presentation: Vertex Exam by:: AThersa Salt. Schwarz RN  Minimal caput noted OA position with suture lines  Patient pushing with decent maternal effort. Contractions are occurring about every 4-6 min. Pitocin at 7.5.   FHT: 115, min to mod variability, no accels, no decels. TOCO: q4-6 min.  A/P: Expectant management for anticipated SVD. Let patient labor down, increase pitocin in meantime. EFW: 6lbs by leopold's Will reassess in a couple hours.

## 2016-09-04 LAB — CBC
HEMATOCRIT: 32.2 % — AB (ref 36.0–46.0)
HEMOGLOBIN: 11.4 g/dL — AB (ref 12.0–15.0)
MCH: 29.4 pg (ref 26.0–34.0)
MCHC: 35.4 g/dL (ref 30.0–36.0)
MCV: 83 fL (ref 78.0–100.0)
Platelets: 219 10*3/uL (ref 150–400)
RBC: 3.88 MIL/uL (ref 3.87–5.11)
RDW: 13.8 % (ref 11.5–15.5)
WBC: 11.8 10*3/uL — AB (ref 4.0–10.5)

## 2016-09-04 LAB — CULTURE, BETA STREP (GROUP B ONLY)

## 2016-09-04 MED ORDER — IBUPROFEN 600 MG PO TABS: 600.0000 mg | ORAL_TABLET | Freq: Four times a day (QID) | ORAL | 0 refills | Status: AC | PRN

## 2016-09-04 NOTE — Discharge Instructions (Signed)

## 2016-09-04 NOTE — Discharge Summary (Signed)
OB Discharge Summary     Patient Name: Alyssa Walton DOB: September 13, 1991 MRN: [id number]  Date of admission: 09/02/2016 Delivering MD: Michaele OfferMUMAW, ELIZABETH WOODLAND   Date of discharge: 09/04/2016  Admitting diagnosis: 36w water broke Intrauterine pregnancy: Delivered @ 37.0wks Secondary diagnosis:  Active Problems:   Preterm PROM with onset of labor within 24 hours of rupture  Additional problems: Rh neg     Discharge diagnosis: Term Pregnancy Delivered                                                                                                Post partum procedures:potential Rhogam- still awaiting baby's Rh status  Augmentation: Pitocin  Complications: None  Hospital course:  Onset of Labor With Vaginal Delivery     25 y.o. yo [id number] at 6448w0d was admitted in Latent Labor on 09/02/2016. Patient had an uncomplicated labor course as follows:  Membrane Rupture Time/Date: 2:45 PM ,09/02/2016   Intrapartum Procedures: Episiotomy: None [1]                                         Lacerations:  2nd degree [3]  After pushing >2hrs pt was offered a VAVD- see Delivery Note for details. Patient had a delivery of a Viable infant. 09/03/2016  Information for the patient's newborn:  Alyssa Walton, Alyssa Walton [[id number]][[id number]]  Delivery Method: Vag-Vacuum    Pateint had an uncomplicated postpartum course.  She is ambulating, tolerating a regular diet, passing flatus, and urinating well. Patient is discharged home in stable condition on 09/04/16. Pt may need Rhogam prior to d/c- still awaiting baby's Rh status.    Physical exam Vitals:   09/03/16 1115 09/03/16 1220 09/03/16 1555 09/04/16 0531  BP: (!) 93/58 94/66 (!) 94/48 (!) 96/52  Pulse: (!) 59 (!) 59 67 (!) 59  Resp: 16 16 16 18   Temp: 97.4 F (36.3 C) 97.6 F (36.4 C) 98.4 F (36.9 C) 98.1 F (36.7 C)  TempSrc: Oral Oral Oral   SpO2:      Weight:      Height:       General: alert and cooperative Lochia: appropriate Uterine Fundus:  firm Incision: N/A DVT Evaluation: No evidence of DVT seen on physical exam. Labs: Lab Results  Component Value Date   WBC 11.8 (H) 09/04/2016   HGB 11.4 (L) 09/04/2016   HCT 32.2 (L) 09/04/2016   MCV 83.0 09/04/2016   PLT 219 09/04/2016   CMP Latest Ref Rng & Units 09/25/2013  Glucose 70 - 99 mg/dL 130(Q104(H)  BUN 6 - 23 mg/dL 6  Creatinine 6.570.50 - 8.461.10 mg/dL 9.620.88  Sodium 952135 - 841145 mEq/L 132(L)  Potassium 3.5 - 5.1 mEq/L 3.4(L)  Chloride 96 - 112 mEq/L 96  CO2 19 - 32 mEq/L 23  Calcium 8.4 - 10.5 mg/dL 9.7  Total Protein 6.0 - 8.3 g/dL 8.0  Total Bilirubin 0.3 - 1.2 mg/dL 0.7  Alkaline Phos 39 - 117 U/L 136(H)  AST 0 - 37 U/L 164(H)  ALT  0 - 35 U/L 65(H)    Discharge instruction: per After Visit Summary and "Baby and Me Booklet".  After visit meds:    Medication List    TAKE these medications   ibuprofen 600 MG tablet Commonly known as:  ADVIL,MOTRIN Take 1 tablet (600 mg total) by mouth every 6 (six) hours as needed.   PNV PO Take 1 tablet by mouth daily.       Diet: routine diet  Activity: Advance as tolerated. Pelvic rest for 6 weeks.   Outpatient follow up:6 weeks Follow up Appt:No future appointments. Follow up Visit:No Follow-up on file.  Postpartum contraception: Combination OCPs- will get [facility] @ PP visit  Newborn Data: Live born female  Birth Weight: 5 lb 4.7 oz (2400 g) APGAR: 7, 8  Baby Feeding: Bottle and Breast- mostly bottle Disposition:home with mother   09/04/2016 Cam Hai, CNM  9:24 AM

## 2016-09-05 ENCOUNTER — Ambulatory Visit: Payer: Self-pay

## 2016-09-05 LAB — TYPE AND SCREEN
ABO/RH(D): B NEG
ANTIBODY SCREEN: POSITIVE
DAT, IgG: NEGATIVE
UNIT DIVISION: 0
Unit division: 0

## 2016-09-05 NOTE — Lactation Note (Signed)
This note was copied from a baby's chart. Lactation Consultation Note  Mother wants to formula feed.  Provided instructions regarding decreasing her milk supply w/ cabbage and ice.  Patient Name: Alyssa Walton ZOXWR'UToday's Date: 09/05/2016     Maternal Data    Feeding    LATCH Score/Interventions                      Lactation Tools Discussed/Used     Consult Status      [name], [name] 09/05/2016, 10:27 AM

## 2016-12-06 IMAGING — US US MFM OB DETAIL+14 WK
1 series · 14 of 28 positions shown · non-contrast
Comparison: none

[Series 1: us mfm ob detail+14 wk · 92 acquisitions, 14 frames shown]
[im 4/92]
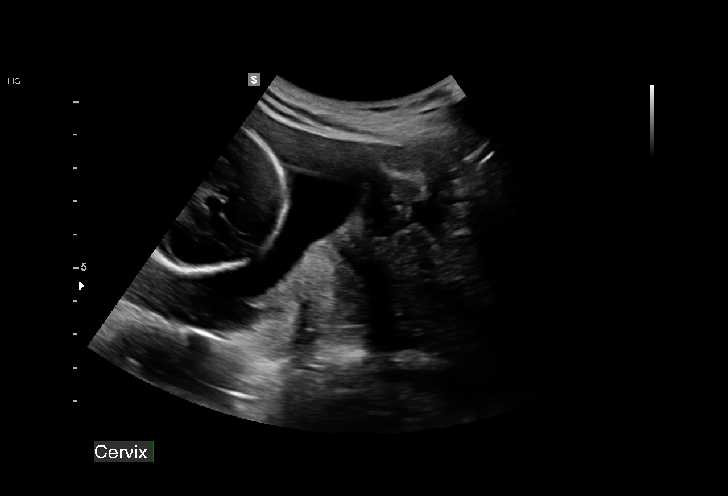
[im 11/92]
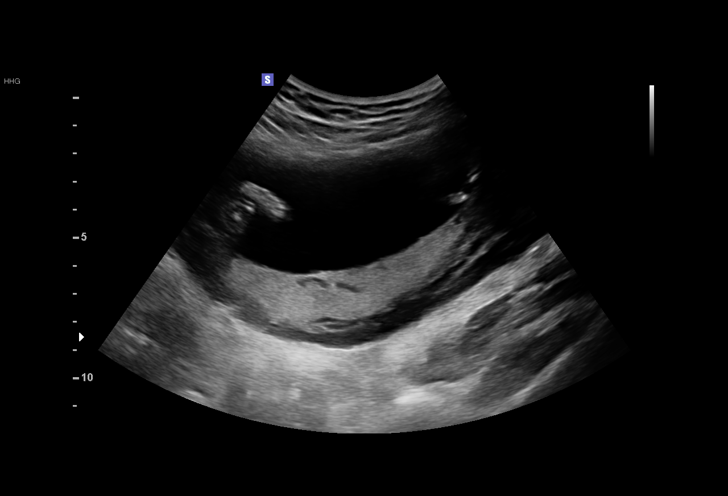
[im 17/92]
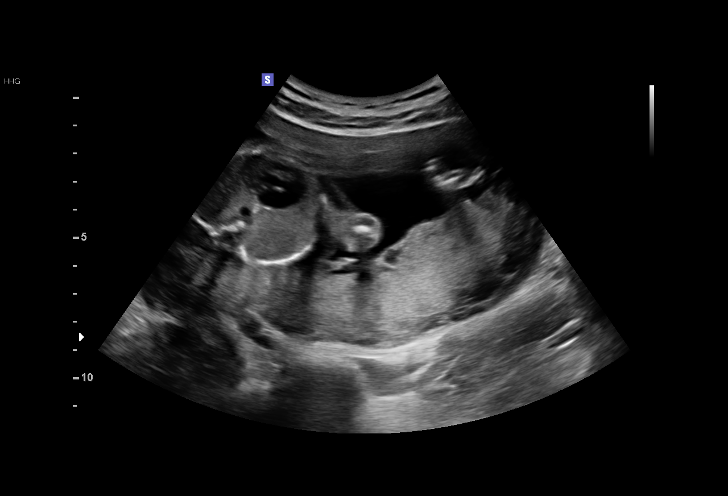
[im 24/92]
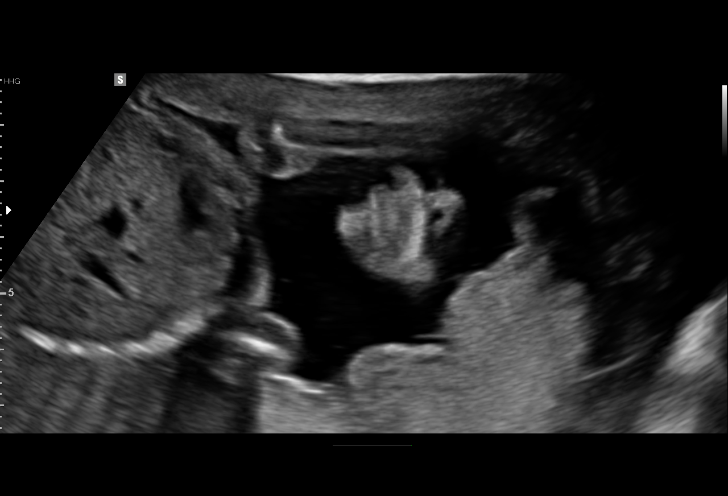
[im 31/92]
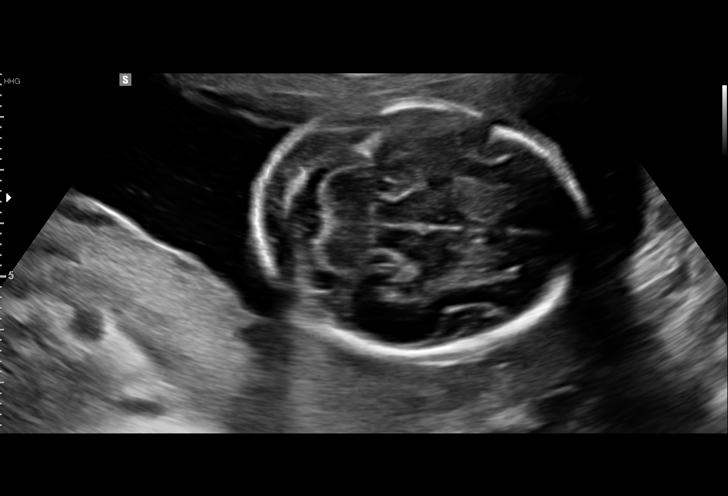
[im 38/92]
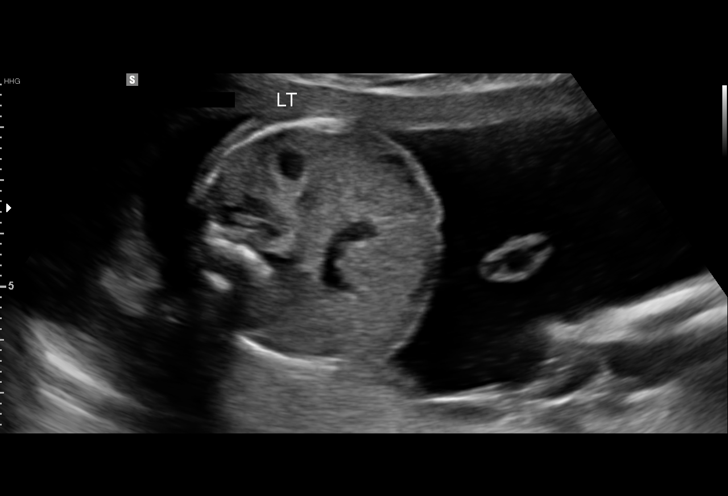
[im 44/92]
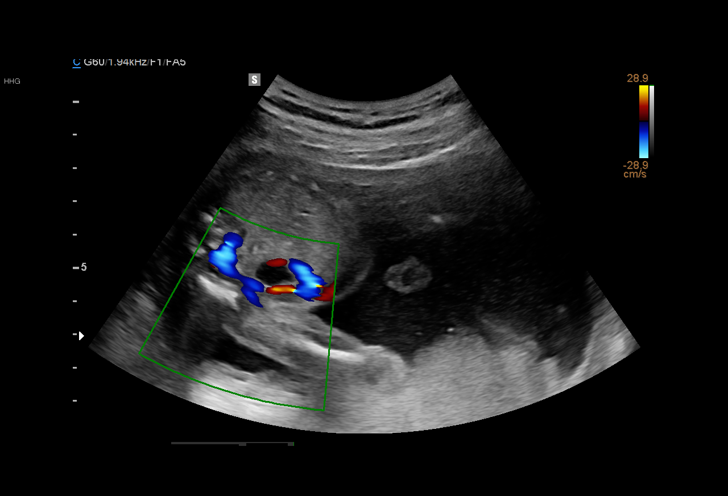
[im 51/92]
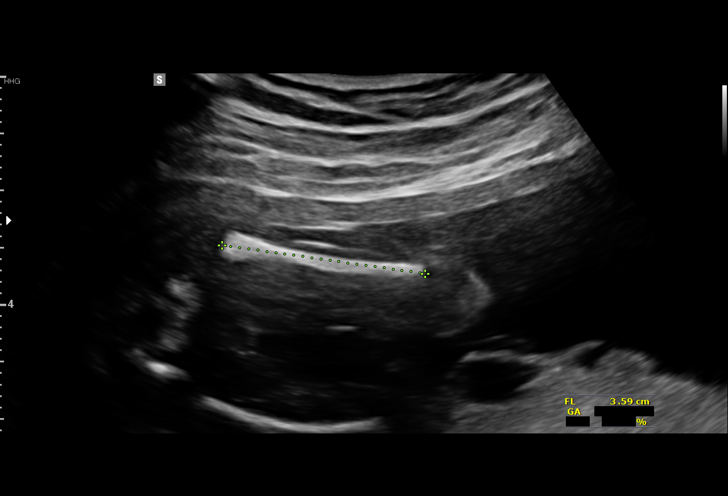
[im 58/92]
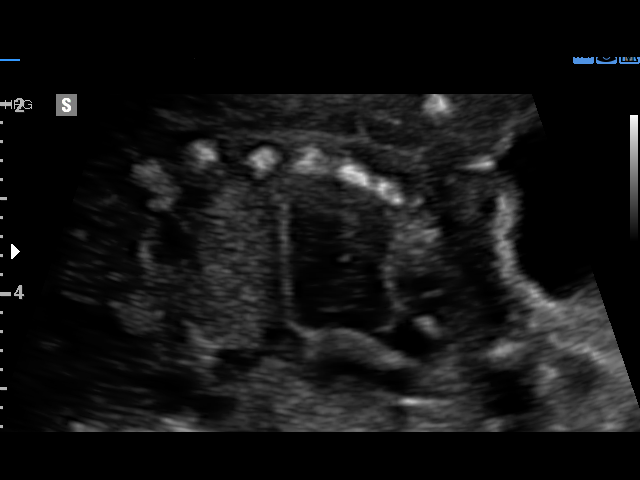
[im 65/92]
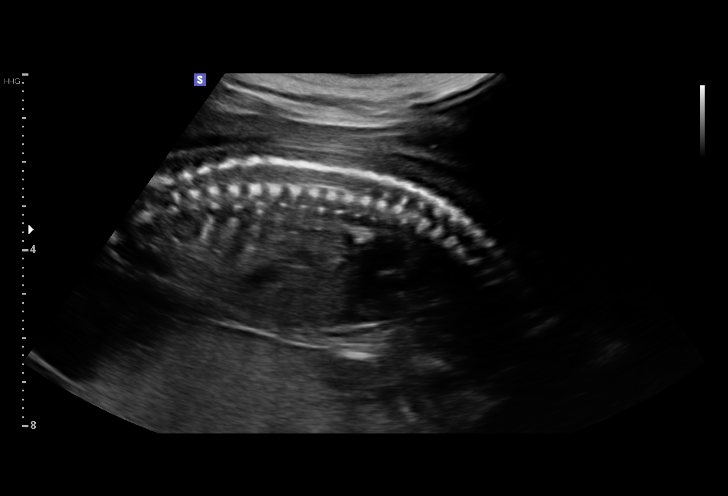
[im 71/92]
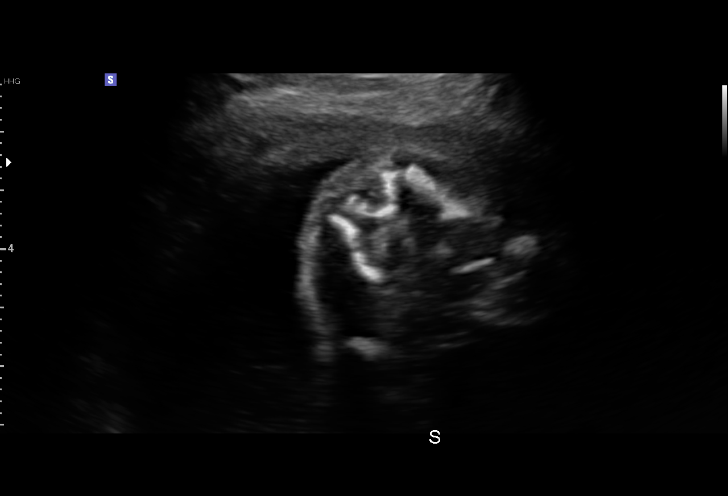
[im 78/92]
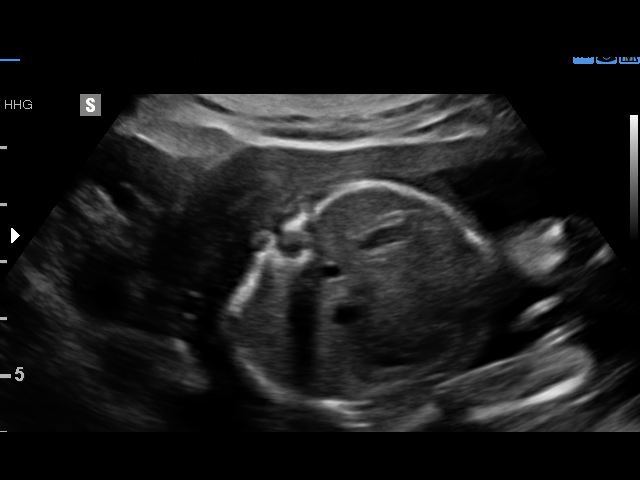
[im 85/92]
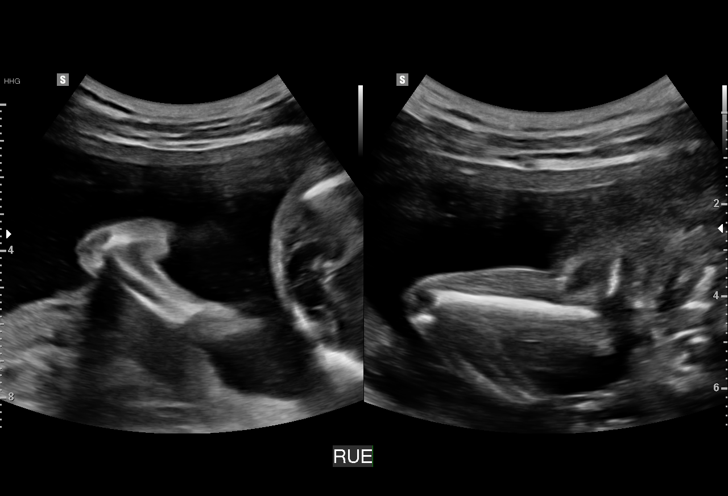
[im 92/92]
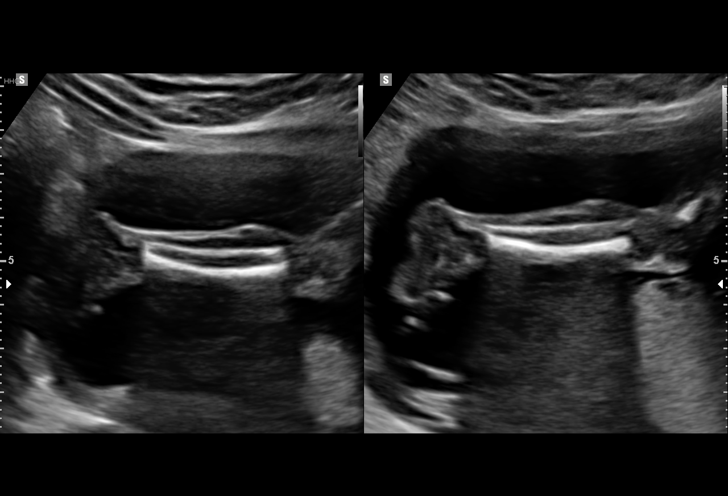

[14 of 28 positions shown; findings below may reference images not displayed]

[REDACTED]-
Faculty Physician

1  SUMAOLUCY JUNICHI            [id number]       [id number]     [id number]
Indications

21 weeks gestation of pregnancy
Detailed fetal anatomic survey                 Z36
Cystic Fibrosis (CF) Carrier, second trimester
Abnormal biochemical finding on antenatal
screening of mother; borderline elevated
MSAFP
Family history of cleft lip and palate
OB History

Gravidity:    3
TOP:          2        Living:  0
Fetal Evaluation

Num Of Fetuses:     1
Fetal Heart         148
Rate(bpm):
Cardiac Activity:   Observed
Presentation:       Cephalic
Placenta:           Posterior, above cervical os
P. Cord Insertion:  Visualized, central

Amniotic Fluid
AFI FV:      Subjectively within normal limits

Largest Pocket(cm)
5
Biometry

BPD:      47.1  mm     G. Age:  20w 2d         16  %    CI:         69.4   %   70 - 86
FL/HC:      19.8   %   15.9 -
HC:      180.5  mm     G. Age:  20w 3d         16  %    HC/AC:      1.18       1.06 -
AC:      152.9  mm     G. Age:  20w 3d         23  %    FL/BPD:     76.0   %
FL:       35.8  mm     G. Age:  21w 2d         47  %    FL/AC:      23.4   %   20 - 24
HUM:        33  mm     G. Age:  21w 1d         47  %
CER:      21.5  mm     G. Age:  20w 4d         34  %

CM:        4.2  mm

Est. FW:     379  gm    0 lb 13 oz      37  %
Gestational Age

LMP:           21w 1d       Date:   12/19/15                 EDD:   09/24/16
U/S Today:     20w 4d                                        EDD:   09/28/16
Best:          21w 1d    Det. By:   LMP  (12/19/15)          EDD:   09/24/16
Anatomy

Cranium:               Appears normal         Aortic Arch:            Appears normal
Cavum:                 Appears normal         Ductal Arch:            Appears normal
Ventricles:            Appears normal         Diaphragm:              Appears normal
Choroid Plexus:        Appears normal         Stomach:                Appears normal, left
sided
Cerebellum:            Appears normal         Abdomen:                Appears normal
Posterior Fossa:       Appears normal         Abdominal Wall:         Appears nml (cord
insert, abd wall)
Nuchal Fold:           Not applicable (>20    Cord Vessels:           Appears normal (3
wks GA)                                        vessel cord)
Face:                  Appears normal         Kidneys:                Appear normal
(orbits and profile)
Lips:                  Appears normal         Bladder:                Appears normal
Thoracic:              Appears normal         Spine:                  Appears normal
Heart:                 Appears normal         Upper Extremities:      Appears normal
(4CH, axis, and
situs)
RVOT:                  Not well visualized    Lower Extremities:      Appears normal
LVOT:                  Appears normal

Other:  Parents do not wish to know sex of fetus. Nasal bone visualized.
Heels visualized.
Cervix Uterus Adnexa

Cervix
Length:            3.2  cm.
Normal appearance by transabdominal scan.

Uterus
No abnormality visualized.

Left Ovary
Size(cm)       3.1 x    1.6    x  2.3       Vol(ml): 6
Within normal limits.

Right Ovary
Not visualized.

Adnexa:       No adnexal mass visualized.
Impression

SIUP at 21+1 weeks
Normal detailed fetal anatomy; limited views of RVOT
Markers of aneuploidy: none
Normal amniotic fluid volume
Measurements consistent with LMP dating
Recommendations

Follow-up as clinically indicated
Please see genetic counseling note
